# Patient Record
Sex: Male | Born: 1985 | Race: White | Hispanic: No | Marital: Single | Smoking: Current every day smoker
Health system: Southern US, Community
[De-identification: ages and names within clinical notes are randomized; demographics above are authoritative.]

## PROBLEM LIST (undated history)

## (undated) DIAGNOSIS — M549 Dorsalgia, unspecified: Secondary | ICD-10-CM

## (undated) DIAGNOSIS — G8929 Other chronic pain: Secondary | ICD-10-CM

## (undated) DIAGNOSIS — J45909 Unspecified asthma, uncomplicated: Secondary | ICD-10-CM

---
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
## 2004-06-18 ENCOUNTER — Emergency Department (HOSPITAL_COMMUNITY): Admission: EM | Admit: 2004-06-18 | Discharge: 2004-06-19 | Payer: Self-pay | Admitting: Emergency Medicine

## 2007-07-20 ENCOUNTER — Emergency Department (HOSPITAL_COMMUNITY): Admission: EM | Admit: 2007-07-20 | Discharge: 2007-07-20 | Payer: Self-pay | Admitting: Emergency Medicine

## 2007-12-20 ENCOUNTER — Emergency Department (HOSPITAL_COMMUNITY): Admission: EM | Admit: 2007-12-20 | Discharge: 2007-12-20 | Payer: Self-pay | Admitting: Emergency Medicine

## 2010-03-06 IMAGING — CR DG RIBS W/ CHEST 3+V*L*
3 series · 3 of 3 positions shown · non-contrast
Comparison: None

CLINICAL DATA: Patient fell of a tree stand.  Left-sided rib pain

LEFT RIBS AND CHEST - 3+ VIEW

[w chest pa]
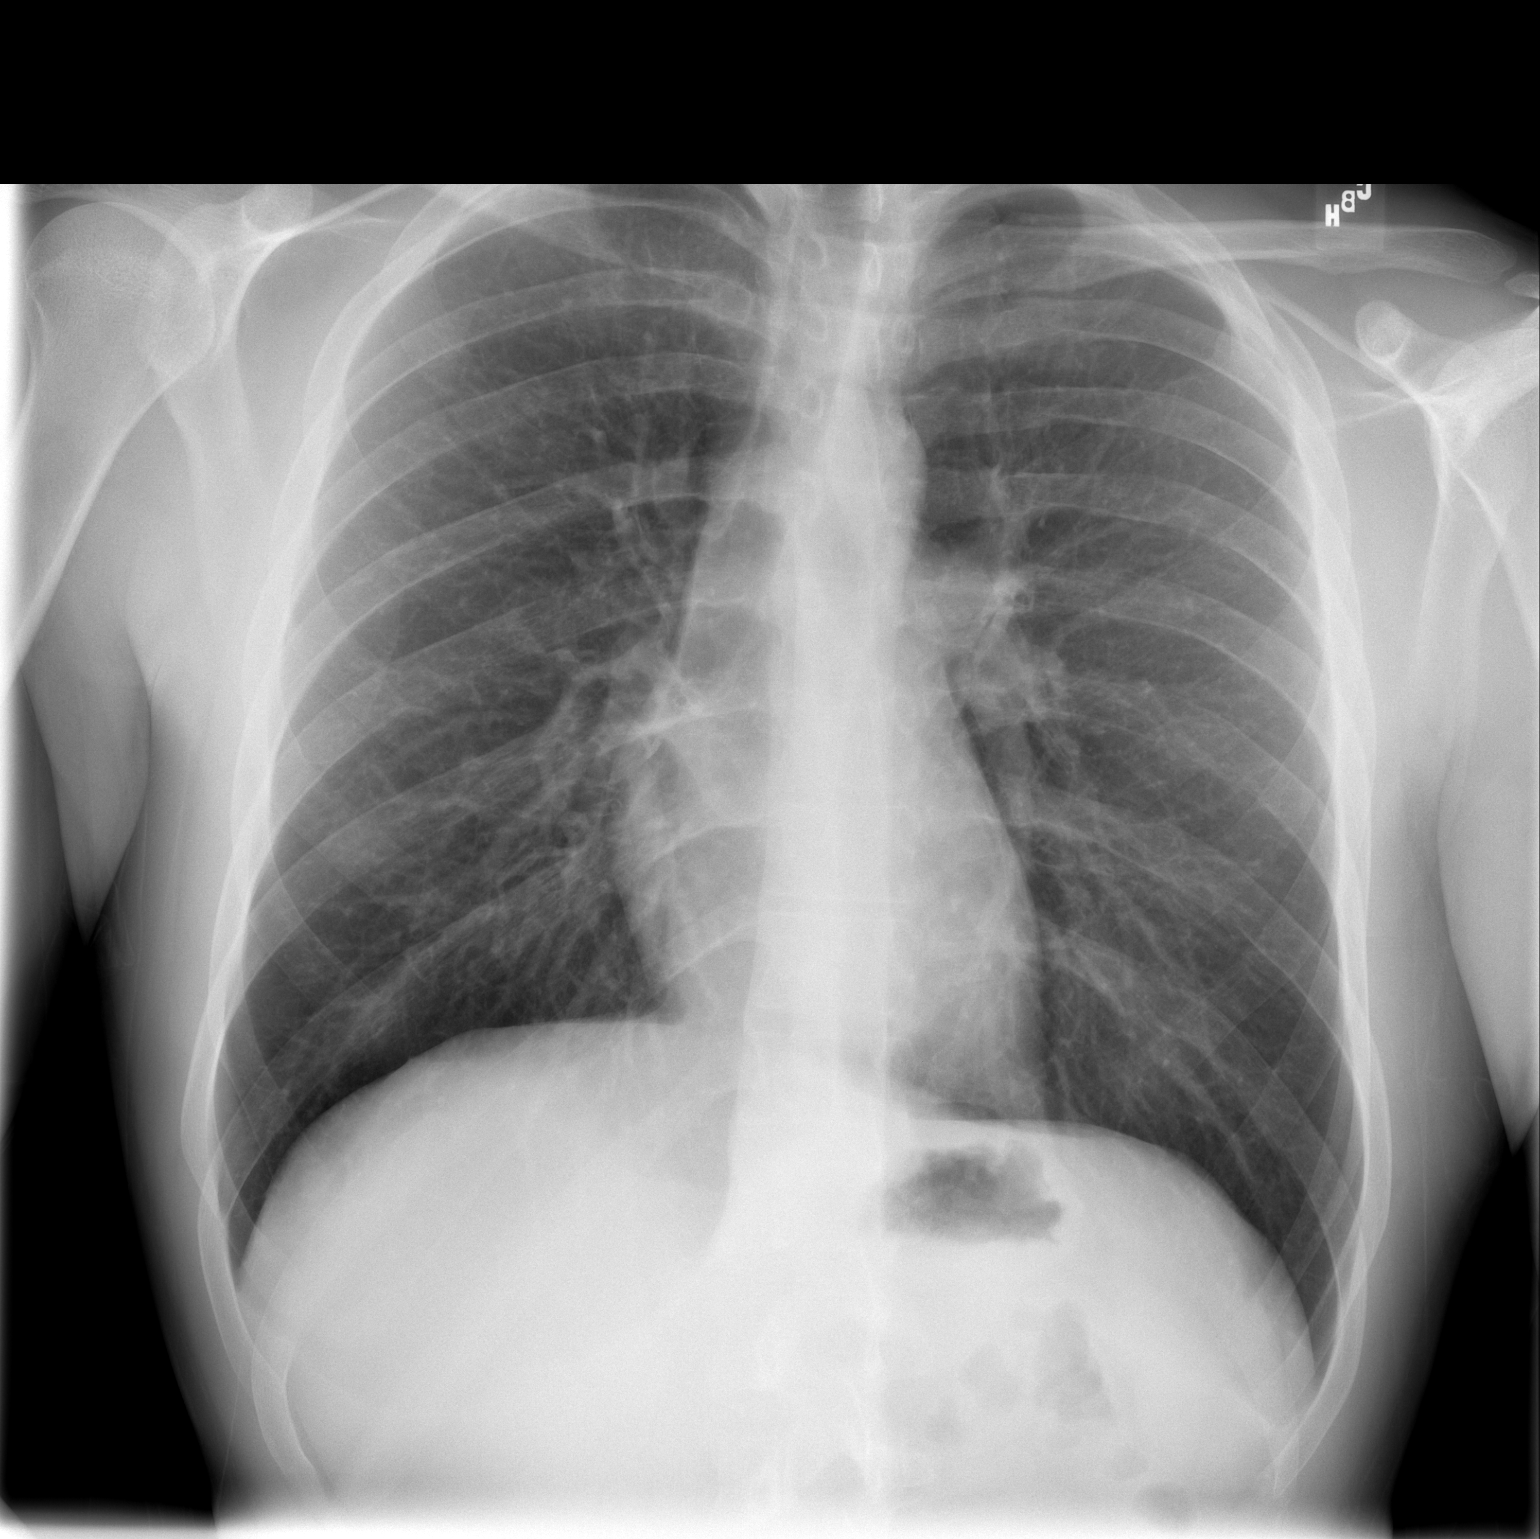

[w ribs ap/pa upper left]
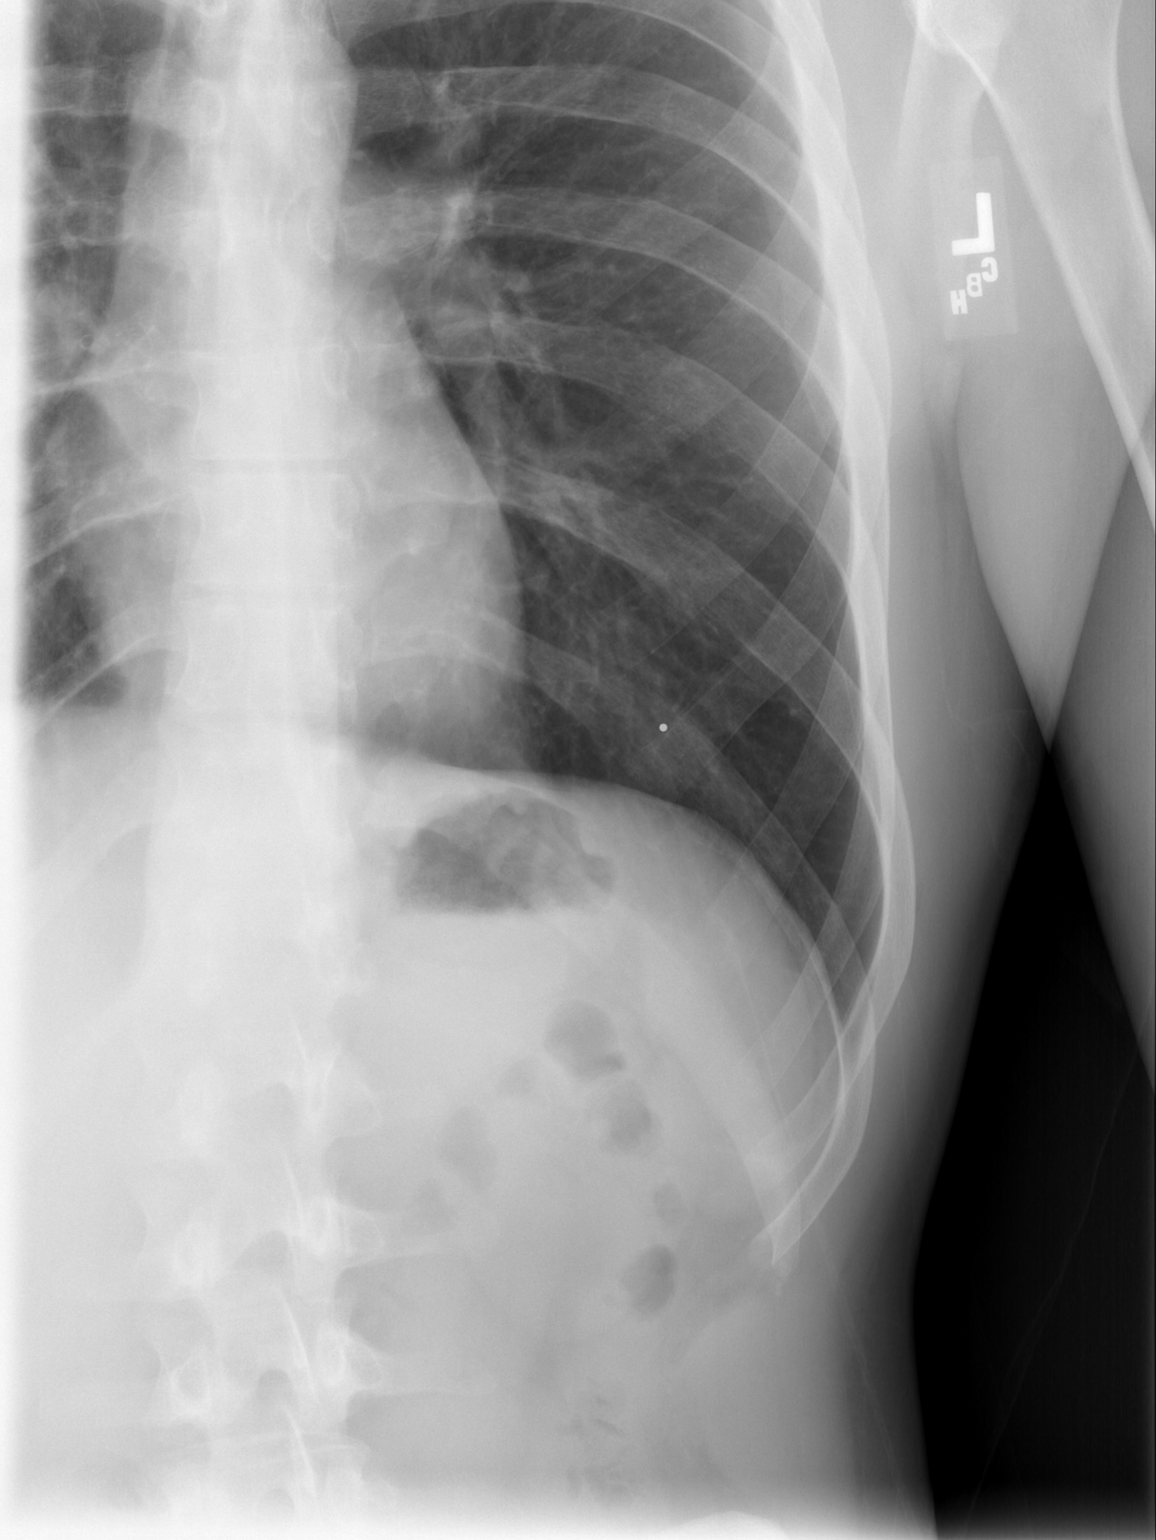

[w ribs oblique left]
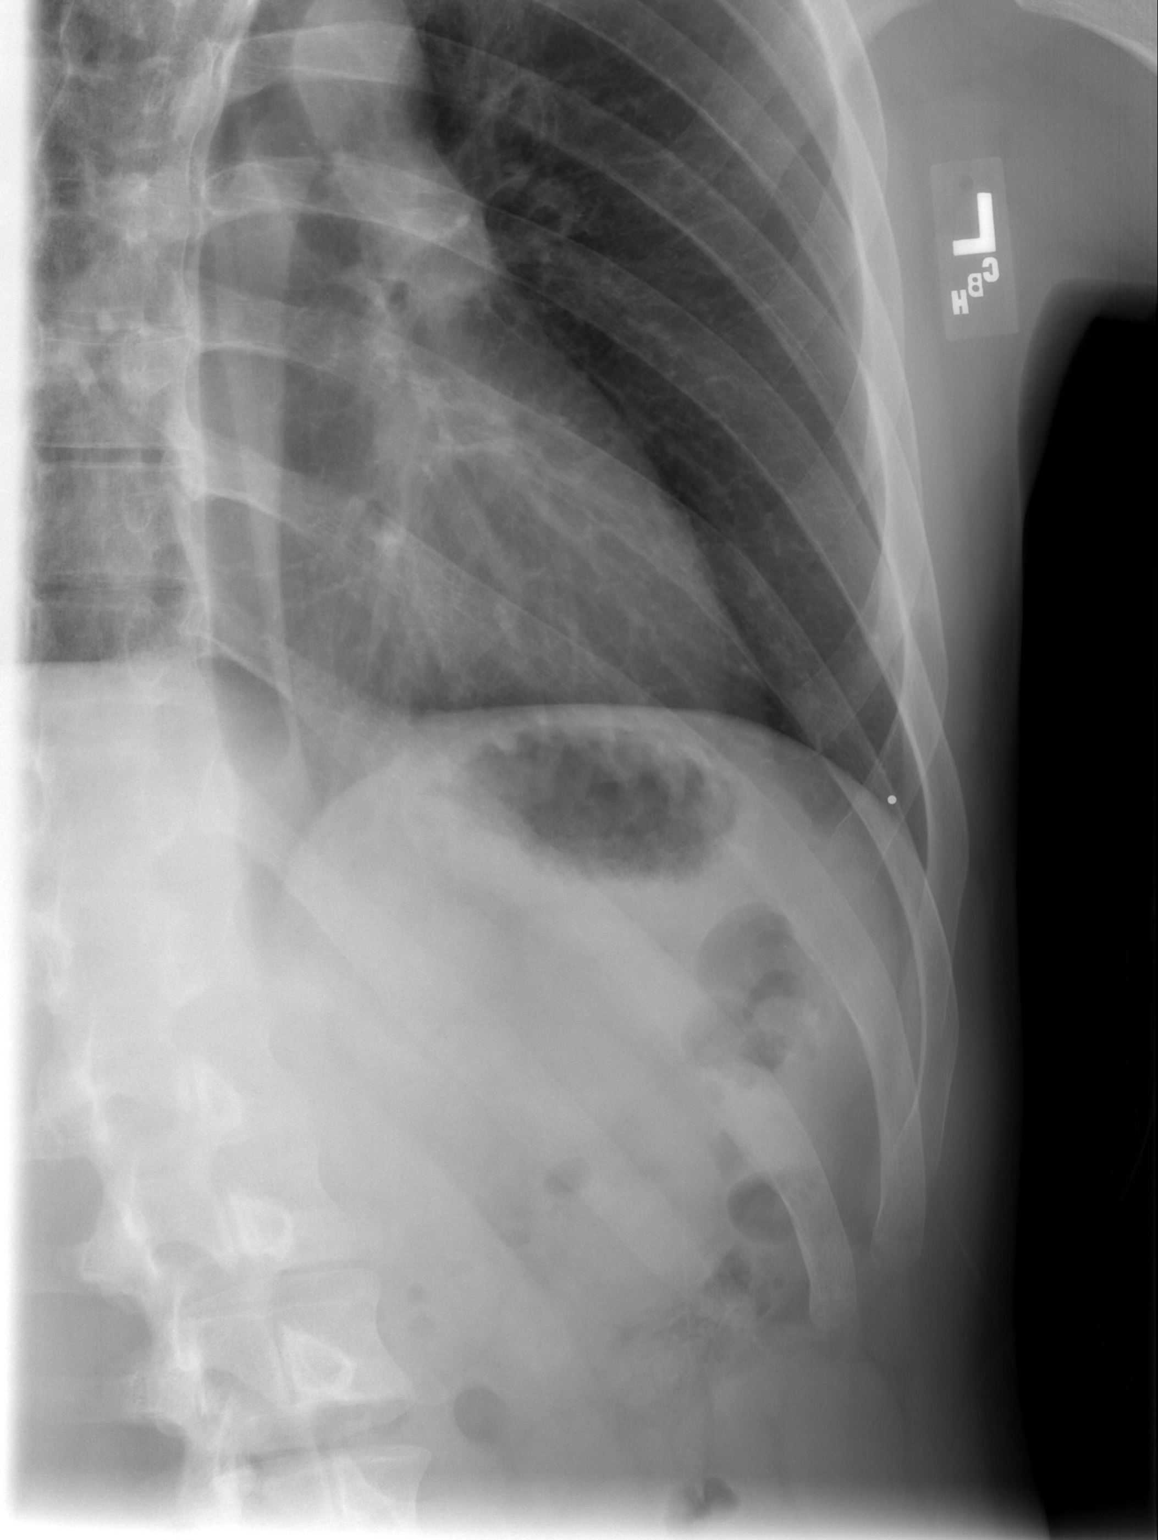

[3 of 3 positions shown; findings below may reference images not displayed]

FINDINGS: The visualized ribs are intact.  No fracture.
IMPRESSION: Negative for fracture.

## 2011-11-16 ENCOUNTER — Emergency Department: Payer: Self-pay | Admitting: Emergency Medicine

## 2012-09-02 ENCOUNTER — Emergency Department (HOSPITAL_COMMUNITY)
Admission: EM | Admit: 2012-09-02 | Discharge: 2012-09-02 | Disposition: A | Discharge status: Home / Self Care | Payer: Self-pay | Attending: Emergency Medicine | Admitting: Emergency Medicine

## 2012-09-02 ENCOUNTER — Encounter (HOSPITAL_COMMUNITY): Payer: Self-pay | Admitting: *Deleted

## 2012-09-02 DIAGNOSIS — Z791 Long term (current) use of non-steroidal anti-inflammatories (NSAID): Secondary | ICD-10-CM | POA: Insufficient documentation

## 2012-09-02 DIAGNOSIS — J45901 Unspecified asthma with (acute) exacerbation: Secondary | ICD-10-CM | POA: Insufficient documentation

## 2012-09-02 DIAGNOSIS — IMO0002 Reserved for concepts with insufficient information to code with codable children: Secondary | ICD-10-CM | POA: Insufficient documentation

## 2012-09-02 DIAGNOSIS — F17200 Nicotine dependence, unspecified, uncomplicated: Secondary | ICD-10-CM | POA: Insufficient documentation

## 2012-09-02 DIAGNOSIS — R0789 Other chest pain: Secondary | ICD-10-CM | POA: Insufficient documentation

## 2012-09-02 DIAGNOSIS — G8929 Other chronic pain: Secondary | ICD-10-CM | POA: Insufficient documentation

## 2012-09-02 DIAGNOSIS — M549 Dorsalgia, unspecified: Secondary | ICD-10-CM | POA: Insufficient documentation

## 2012-09-02 HISTORY — DX: Unspecified asthma, uncomplicated: J45.909

## 2012-09-02 MED ORDER — ALBUTEROL SULFATE (5 MG/ML) 0.5% IN NEBU
5.0000 mg | INHALATION_SOLUTION | Freq: Once | RESPIRATORY_TRACT | Status: AC
  Administered 2012-09-02: 5 mg via RESPIRATORY_TRACT
  Filled 2012-09-02: qty 1

## 2012-09-02 MED ORDER — PREDNISONE 20 MG PO TABS: 60.0000 mg | ORAL_TABLET | Freq: Every day | ORAL | Status: DC

## 2012-09-02 MED ORDER — IPRATROPIUM BROMIDE 0.02 % IN SOLN
0.5000 mg | Freq: Once | RESPIRATORY_TRACT | Status: AC
  Administered 2012-09-02: 0.5 mg via RESPIRATORY_TRACT
  Filled 2012-09-02 (×2): qty 2.5

## 2012-09-02 MED ORDER — PREDNISONE 50 MG PO TABS
60.0000 mg | ORAL_TABLET | Freq: Once | ORAL | Status: AC
  Administered 2012-09-02: 60 mg via ORAL
  Filled 2012-09-02: qty 1

## 2012-09-02 MED ORDER — ALBUTEROL SULFATE HFA 108 (90 BASE) MCG/ACT IN AERS
2.0000 | INHALATION_SPRAY | RESPIRATORY_TRACT | Status: DC
  Administered 2012-09-02: 2 via RESPIRATORY_TRACT
  Filled 2012-09-02: qty 6.7

## 2012-09-02 MED ORDER — ALBUTEROL SULFATE HFA 108 (90 BASE) MCG/ACT IN AERS: 1.0000 | INHALATION_SPRAY | Freq: Four times a day (QID) | RESPIRATORY_TRACT | Status: AC | PRN

## 2012-09-02 NOTE — ED Notes (Signed)
States that he has been having problems breathing since last night, states that he has a history of asthma attacks.  Expiratory wheezing noted at triage, however no obvious respiratory distress, O2 sat 100% on room air.

## 2012-09-07 ENCOUNTER — Emergency Department (HOSPITAL_COMMUNITY)
Admission: EM | Admit: 2012-09-07 | Discharge: 2012-09-07 | Disposition: A | Discharge status: Home / Self Care | Payer: Self-pay | Attending: Emergency Medicine | Admitting: Emergency Medicine

## 2012-09-07 ENCOUNTER — Encounter (HOSPITAL_COMMUNITY): Payer: Self-pay

## 2012-09-07 DIAGNOSIS — Y929 Unspecified place or not applicable: Secondary | ICD-10-CM | POA: Insufficient documentation

## 2012-09-07 DIAGNOSIS — F17200 Nicotine dependence, unspecified, uncomplicated: Secondary | ICD-10-CM | POA: Insufficient documentation

## 2012-09-07 DIAGNOSIS — IMO0002 Reserved for concepts with insufficient information to code with codable children: Secondary | ICD-10-CM | POA: Insufficient documentation

## 2012-09-07 DIAGNOSIS — J45909 Unspecified asthma, uncomplicated: Secondary | ICD-10-CM | POA: Insufficient documentation

## 2012-09-07 DIAGNOSIS — X500XXA Overexertion from strenuous movement or load, initial encounter: Secondary | ICD-10-CM | POA: Insufficient documentation

## 2012-09-07 DIAGNOSIS — S39012A Strain of muscle, fascia and tendon of lower back, initial encounter: Secondary | ICD-10-CM

## 2012-09-07 DIAGNOSIS — Y9389 Activity, other specified: Secondary | ICD-10-CM | POA: Insufficient documentation

## 2012-09-07 DIAGNOSIS — S335XXA Sprain of ligaments of lumbar spine, initial encounter: Secondary | ICD-10-CM | POA: Insufficient documentation

## 2012-09-07 HISTORY — DX: Dorsalgia, unspecified: M54.9

## 2012-09-07 HISTORY — DX: Other chronic pain: G89.29

## 2012-09-07 MED ORDER — NAPROXEN 500 MG PO TABS: 500.0000 mg | ORAL_TABLET | Freq: Two times a day (BID) | ORAL | Status: AC

## 2012-09-07 MED ORDER — OXYCODONE-ACETAMINOPHEN 5-325 MG PO TABS
1.0000 | ORAL_TABLET | ORAL | Status: AC | PRN
Start: 2012-09-07 — End: ?

## 2012-09-07 NOTE — ED Notes (Signed)
States he injured back lifting furniture yesterday

## 2012-09-07 NOTE — ED Provider Notes (Signed)
CSN: [id number]     Arrival date & time 09/07/12  1325 History     First MD Initiated Contact with Patient 09/07/12 1409     Chief Complaint  Patient presents with  . Back Pain   (Consider location/radiation/quality/duration/timing/severity/associated sxs/prior Treatment) Patient is a 27 y.o. male presenting with back pain. The history is provided by the patient.  Back Pain Location:  Lumbar spine Quality:  Aching and shooting Radiates to:  R thigh and R posterior upper leg Pain severity:  Moderate Pain is:  Same all the time Onset quality:  Gradual Duration:  1 day Timing:  Constant Progression:  Worsening Chronicity:  Recurrent Context: twisting   Context: not recent injury   Relieved by:  Bed rest Ineffective treatments:  Muscle relaxants and OTC medications Associated symptoms: leg pain   Associated symptoms: no abdominal pain, no abdominal swelling, no bladder incontinence, no bowel incontinence, no chest pain, no dysuria, no fever, no headaches, no numbness, no paresthesias, no pelvic pain, no perianal numbness, no tingling, no weakness and no weight loss     Past Medical History  Diagnosis Date  . Asthma   . Chronic back pain    No past surgical history on file. No family history on file. History  Substance Use Topics  . Smoking status: Current Every Day Smoker -- 0.50 packs/day    Types: Cigarettes  . Smokeless tobacco: Not on file  . Alcohol Use: Yes     Comment: social    Review of Systems  Constitutional: Negative for fever and weight loss.  Respiratory: Negative for shortness of breath.   Cardiovascular: Negative for chest pain.  Gastrointestinal: Negative for vomiting, abdominal pain, constipation and bowel incontinence.  Genitourinary: Negative for bladder incontinence, dysuria, hematuria, flank pain, decreased urine volume, difficulty urinating and pelvic pain.       No perineal numbness or incontinence of urine or feces  Musculoskeletal: Positive  for back pain. Negative for joint swelling.  Skin: Negative for rash.  Neurological: Negative for tingling, weakness, numbness, headaches and paresthesias.  All other systems reviewed and are negative.    Allergies  Review of patient's allergies indicates no known allergies.  Home Medications   Current Outpatient Rx  Name  Route  Sig  Dispense  Refill  . cyclobenzaprine (FLEXERIL) 10 MG tablet   Oral   Take 10 mg by mouth 3 (three) times daily as needed for muscle spasms.         Marland Kitchen HYDROcodone-acetaminophen (NORCO) 10-325 MG per tablet   Oral   Take 1 tablet by mouth every 6 (six) hours as needed for pain.         Marland Kitchen albuterol (PROVENTIL HFA;VENTOLIN HFA) 108 (90 BASE) MCG/ACT inhaler   Inhalation   Inhale 1-2 puffs into the lungs every 6 (six) hours as needed for wheezing.   1 Inhaler   0   . predniSONE (DELTASONE) 20 MG tablet   Oral   Take 3 tablets (60 mg total) by mouth daily.   12 tablet   0    BP 141/81  Pulse 86  Temp(Src) 98.3 F (36.8 C) (Oral)  Resp 18  Ht 5\' 10"  (1.778 m)  Wt 180 lb (81.647 kg)  BMI 25.83 kg/m2  SpO2 98% Physical Exam  Nursing note and vitals reviewed. Constitutional: He is oriented to person, place, and time. He appears well-developed and well-nourished. No distress.  HENT:  Head: Normocephalic and atraumatic.  Neck: Normal range of motion. Neck supple.  Cardiovascular: Normal rate, regular rhythm, normal heart sounds and intact distal pulses.   No murmur heard. Pulmonary/Chest: Effort normal and breath sounds normal. No respiratory distress.  Musculoskeletal: He exhibits tenderness. He exhibits no edema.       Lumbar back: He exhibits tenderness and pain. He exhibits normal range of motion, no swelling, no deformity, no laceration and normal pulse.  ttp of the right lumbar spine and paraspinal muscles. DP pulses are brisk and symmetrical.  Distal sensation intact.  Hip Flexors/Extensors are intact  Neurological: He is alert and  oriented to person, place, and time. No cranial nerve deficit or sensory deficit. He exhibits normal muscle tone. Coordination and gait normal.  Reflex Scores:      Patellar reflexes are 2+ on the right side and 2+ on the left side.      Achilles reflexes are 2+ on the right side and 2+ on the left side. Skin: Skin is warm and dry.    ED Course   Procedures (including critical care time)  Labs Reviewed - No data to display No results found. No diagnosis found.  MDM    Patient has ttp of the right lumbar paraspinal muscles.  No focal neuro deficits on exam.  Ambulates with a steady gait.   Pain similar to previous pain.  Doubt emergent neuro or infectious process.  Advised pt that he will need further management by his PMD or pain management.  Referral info given.  VSS.  Patient appears stable for d/c  Kaileah Shevchenko L. Trisha Mangle, PA-C 09/08/12 2115

## 2012-09-07 NOTE — ED Provider Notes (Signed)
CSN: [id number]     Arrival date & time 09/02/12  3086 History     First MD Initiated Contact with Patient 09/02/12 1007     Chief Complaint  Patient presents with  . Asthma   (Consider location/radiation/quality/duration/timing/severity/associated sxs/prior Treatment) Patient is a 27 y.o. male presenting with asthma. The history is provided by the patient.  Asthma This is a recurrent problem. The current episode started yesterday. The problem has been unchanged. Pertinent negatives include no abdominal pain, arthralgias, chest pain, chills, congestion, coughing, fatigue, fever, headaches, joint swelling, nausea, neck pain, numbness, rash, sore throat, swollen glands or weakness. The symptoms are aggravated by exertion. He has tried nothing (He is out of his albuterol) for the symptoms.    Past Medical History  Diagnosis Date  . Asthma   . Chronic back pain    History reviewed. No pertinent past surgical history. No family history on file. History  Substance Use Topics  . Smoking status: Current Every Day Smoker -- 0.50 packs/day    Types: Cigarettes  . Smokeless tobacco: Not on file  . Alcohol Use: Yes     Comment: social    Review of Systems  Constitutional: Negative for fever, chills and fatigue.  HENT: Negative for congestion, sore throat and neck pain.   Eyes: Negative.   Respiratory: Positive for chest tightness and wheezing. Negative for cough and shortness of breath.   Cardiovascular: Negative for chest pain.  Gastrointestinal: Negative for nausea and abdominal pain.  Genitourinary: Negative.   Musculoskeletal: Negative for joint swelling and arthralgias.  Skin: Negative.  Negative for rash and wound.  Neurological: Negative for dizziness, weakness, light-headedness, numbness and headaches.  Psychiatric/Behavioral: Negative.     Allergies  Review of patient's allergies indicates no known allergies.  Home Medications   Current Outpatient Rx  Name  Route  Sig   Dispense  Refill  . albuterol (PROVENTIL HFA;VENTOLIN HFA) 108 (90 BASE) MCG/ACT inhaler   Inhalation   Inhale 1-2 puffs into the lungs every 6 (six) hours as needed for wheezing.   1 Inhaler   0   . cyclobenzaprine (FLEXERIL) 10 MG tablet   Oral   Take 10 mg by mouth 3 (three) times daily as needed for muscle spasms.         Marland Kitchen HYDROcodone-acetaminophen (NORCO) 10-325 MG per tablet   Oral   Take 1 tablet by mouth every 6 (six) hours as needed for pain.         . naproxen (NAPROSYN) 500 MG tablet   Oral   Take 1 tablet (500 mg total) by mouth 2 (two) times daily with a meal.   20 tablet   0   . oxyCODONE-acetaminophen (PERCOCET/ROXICET) 5-325 MG per tablet   Oral   Take 1 tablet by mouth every 4 (four) hours as needed for pain.   10 tablet   0   . predniSONE (DELTASONE) 20 MG tablet   Oral   Take 3 tablets (60 mg total) by mouth daily.   12 tablet   0    BP 121/73  Pulse 79  Temp(Src) 97.9 F (36.6 C) (Oral)  Resp 20  Wt 180 lb (81.647 kg)  SpO2 96% Physical Exam  Nursing note and vitals reviewed. Constitutional: He appears well-developed and well-nourished.  HENT:  Head: Normocephalic and atraumatic.  Eyes: Conjunctivae are normal.  Neck: Normal range of motion.  Cardiovascular: Normal rate, regular rhythm, normal heart sounds and intact distal pulses.   Pulmonary/Chest: Effort  normal. No respiratory distress. He has no wheezes.  Decreased breath sounds throughout all lung fields. He has no active wheezing on exam.  Abdominal: Soft. Bowel sounds are normal. There is no tenderness.  Musculoskeletal: Normal range of motion.  Neurological: He is alert.  Skin: Skin is warm and dry.  Psychiatric: He has a normal mood and affect.    ED Course   Procedures (including critical care time)  Labs Reviewed - No data to display No results found. 1. Asthma attack    Medications  albuterol (PROVENTIL) (5 MG/ML) 0.5% nebulizer solution 5 mg (5 mg Nebulization  Given 09/02/12 1035)  ipratropium (ATROVENT) nebulizer solution 0.5 mg (0.5 mg Nebulization Given 09/02/12 1035)  predniSONE (DELTASONE) tablet 60 mg (60 mg Oral Given 09/02/12 1023)   Pt examined after albuterol and atrovent neb,  Prednisone given with great improvement in aeration.  No wheezing at re-exam.  Pt felt at his baseline with his breathing. Breath sounds equal bilaterally, no rhonchi,  Or history suggesting need for xrays. MDM  The patient appears reasonably screened and/or stabilized for discharge and I doubt any other medical condition or other [facility] requiring further screening, evaluation, or treatment in the ED at this time prior to discharge. Pt was given an albuterol mdi,  Also prescription for same, prednisone 5 day pulse dose.  Encouraged return here for any worsened sx.  Pt understands plan.  Burgess Amor, PA-C 09/07/12 2258

## 2012-09-07 NOTE — ED Notes (Signed)
Pt woke today having low back pain, able to drive self to the ed. In wheelchair in triage, stated his L4 is damaged.

## 2012-09-09 NOTE — ED Provider Notes (Signed)
History/physical exam/procedure(s) were performed by non-physician practitioner and as supervising physician I was immediately available for consultation/collaboration. I have reviewed all notes and am in agreement with care and plan.   Hilario Quarry, MD 09/09/12 [phone]

## 2012-09-14 NOTE — ED Provider Notes (Signed)
Medical screening examination/treatment/procedure(s) were performed by non-physician practitioner and as supervising physician I was immediately available for consultation/collaboration.   Jjesus Dingley W. Lilygrace Rodick, MD 09/14/12 0005 

## 2014-02-19 ENCOUNTER — Emergency Department (HOSPITAL_COMMUNITY): Payer: Medicaid Other

## 2014-02-19 ENCOUNTER — Inpatient Hospital Stay (HOSPITAL_COMMUNITY)
Admission: EM | Admit: 2014-02-19 | Discharge: 2014-02-20 | DRG: 203 | Disposition: A | Discharge status: Home / Self Care | Payer: Medicaid Other | Attending: Internal Medicine | Admitting: Internal Medicine

## 2014-02-19 ENCOUNTER — Encounter (HOSPITAL_COMMUNITY): Payer: Self-pay | Admitting: *Deleted

## 2014-02-19 DIAGNOSIS — J209 Acute bronchitis, unspecified: Secondary | ICD-10-CM | POA: Diagnosis present

## 2014-02-19 DIAGNOSIS — Z720 Tobacco use: Secondary | ICD-10-CM | POA: Diagnosis present

## 2014-02-19 DIAGNOSIS — R0602 Shortness of breath: Secondary | ICD-10-CM | POA: Diagnosis present

## 2014-02-19 DIAGNOSIS — F419 Anxiety disorder, unspecified: Secondary | ICD-10-CM | POA: Diagnosis present

## 2014-02-19 DIAGNOSIS — R000 Tachycardia, unspecified: Secondary | ICD-10-CM | POA: Diagnosis present

## 2014-02-19 DIAGNOSIS — J45901 Unspecified asthma with (acute) exacerbation: Principal | ICD-10-CM

## 2014-02-19 DIAGNOSIS — Z8249 Family history of ischemic heart disease and other diseases of the circulatory system: Secondary | ICD-10-CM | POA: Diagnosis not present

## 2014-02-19 DIAGNOSIS — F1020 Alcohol dependence, uncomplicated: Secondary | ICD-10-CM | POA: Diagnosis present

## 2014-02-19 DIAGNOSIS — F17210 Nicotine dependence, cigarettes, uncomplicated: Secondary | ICD-10-CM | POA: Diagnosis present

## 2014-02-19 LAB — BASIC METABOLIC PANEL
Anion gap: 11 (ref 5–15)
BUN: 15 mg/dL (ref 6–23)
CHLORIDE: 105 meq/L (ref 96–112)
CO2: 23 mmol/L (ref 19–32)
Calcium: 9 mg/dL (ref 8.4–10.5)
Creatinine, Ser: 0.98 mg/dL (ref 0.50–1.35)
GFR calc Af Amer: >90 mL/min (ref >90)
GLUCOSE: 87 mg/dL (ref 70–99)
Potassium: 3.5 mmol/L (ref 3.5–5.1)
SODIUM: 139 mmol/L (ref 135–145)

## 2014-02-19 LAB — CBC WITH DIFFERENTIAL/PLATELET
BASOS ABS: 0 10*3/uL (ref 0.0–0.1)
BASOS PCT: 0 % (ref 0–1)
EOS ABS: 0.7 10*3/uL (ref 0.0–0.7)
EOS PCT: 4 % (ref 0–5)
HEMATOCRIT: 46.6 % (ref 39.0–52.0)
HEMOGLOBIN: 15.7 g/dL (ref 13.0–17.0)
LYMPHS PCT: 15 % (ref 12–46)
Lymphs Abs: 2.7 10*3/uL (ref 0.7–4.0)
MCH: 29.4 pg (ref 26.0–34.0)
MCHC: 33.7 g/dL (ref 30.0–36.0)
MCV: 87.3 fL (ref 78.0–100.0)
Monocytes Absolute: 1.4 10*3/uL — ABNORMAL HIGH (ref 0.1–1.0)
Monocytes Relative: 8 % (ref 3–12)
Neutro Abs: 13.5 10*3/uL — ABNORMAL HIGH (ref 1.7–7.7)
Neutrophils Relative %: 73 % (ref 43–77)
Platelets: 347 10*3/uL (ref 150–400)
RBC: 5.34 MIL/uL (ref 4.22–5.81)
RDW: 12.6 % (ref 11.5–15.5)
WBC: 18.3 10*3/uL — AB (ref 4.0–10.5)

## 2014-02-19 MED ORDER — METHYLPREDNISOLONE SODIUM SUCC 125 MG IJ SOLR
80.0000 mg | Freq: Three times a day (TID) | INTRAMUSCULAR | Status: DC
  Administered 2014-02-20: 80 mg via INTRAVENOUS
  Filled 2014-02-19 (×4): qty 1.28

## 2014-02-19 MED ORDER — ENOXAPARIN SODIUM 40 MG/0.4ML ~~LOC~~ SOLN
40.0000 mg | Freq: Every day | SUBCUTANEOUS | Status: DC
  Administered 2014-02-19: 40 mg via SUBCUTANEOUS
  Filled 2014-02-19 (×2): qty 0.4

## 2014-02-19 MED ORDER — ALPRAZOLAM 1 MG PO TABS
1.0000 mg | ORAL_TABLET | Freq: Every day | ORAL | Status: DC
  Administered 2014-02-20: 1 mg via ORAL
  Filled 2014-02-19: qty 1

## 2014-02-19 MED ORDER — AZITHROMYCIN 250 MG PO TABS
250.0000 mg | ORAL_TABLET | Freq: Every day | ORAL | Status: DC
  Filled 2014-02-19: qty 1

## 2014-02-19 MED ORDER — SODIUM CHLORIDE 0.9 % IJ SOLN
3.0000 mL | Freq: Two times a day (BID) | INTRAMUSCULAR | Status: DC
  Administered 2014-02-20: 3 mL via INTRAVENOUS

## 2014-02-19 MED ORDER — SODIUM CHLORIDE 0.9 % IV SOLN: 250.0000 mL | INTRAVENOUS | Status: DC | PRN

## 2014-02-19 MED ORDER — CHLORDIAZEPOXIDE HCL 25 MG PO CAPS
50.0000 mg | ORAL_CAPSULE | Freq: Every day | ORAL | Status: AC
  Administered 2014-02-19: 50 mg via ORAL
  Filled 2014-02-19: qty 2

## 2014-02-19 MED ORDER — IPRATROPIUM-ALBUTEROL 0.5-2.5 (3) MG/3ML IN SOLN
3.0000 mL | Freq: Once | RESPIRATORY_TRACT | Status: AC
  Administered 2014-02-19: 3 mL via RESPIRATORY_TRACT
  Filled 2014-02-19: qty 3

## 2014-02-19 MED ORDER — NICOTINE 21 MG/24HR TD PT24
21.0000 mg | MEDICATED_PATCH | Freq: Every day | TRANSDERMAL | Status: DC | PRN
  Administered 2014-02-19: 21 mg via TRANSDERMAL
  Filled 2014-02-19 (×2): qty 1

## 2014-02-19 MED ORDER — ACETAMINOPHEN 650 MG RE SUPP: 650.0000 mg | Freq: Four times a day (QID) | RECTAL | Status: DC | PRN

## 2014-02-19 MED ORDER — ALBUTEROL SULFATE (2.5 MG/3ML) 0.083% IN NEBU
2.5000 mg | INHALATION_SOLUTION | Freq: Four times a day (QID) | RESPIRATORY_TRACT | Status: DC | PRN
  Administered 2014-02-20 (×3): 2.5 mg via RESPIRATORY_TRACT
  Filled 2014-02-19 (×3): qty 3

## 2014-02-19 MED ORDER — LORAZEPAM 2 MG/ML IJ SOLN
1.0000 mg | Freq: Once | INTRAMUSCULAR | Status: AC
  Administered 2014-02-19: 1 mg via INTRAVENOUS
  Filled 2014-02-19: qty 1

## 2014-02-19 MED ORDER — SODIUM CHLORIDE 0.9 % IJ SOLN
3.0000 mL | Freq: Two times a day (BID) | INTRAMUSCULAR | Status: DC
Start: 2014-02-19 — End: 2014-02-20

## 2014-02-19 MED ORDER — SODIUM CHLORIDE 0.9 % IJ SOLN: 3.0000 mL | INTRAMUSCULAR | Status: DC | PRN

## 2014-02-19 MED ORDER — AZITHROMYCIN 500 MG PO TABS
500.0000 mg | ORAL_TABLET | Freq: Every day | ORAL | Status: AC
  Administered 2014-02-19: 500 mg via ORAL
  Filled 2014-02-19: qty 1

## 2014-02-19 MED ORDER — ALBUTEROL SULFATE (2.5 MG/3ML) 0.083% IN NEBU
5.0000 mg | INHALATION_SOLUTION | Freq: Once | RESPIRATORY_TRACT | Status: AC
  Administered 2014-02-19: 5 mg via RESPIRATORY_TRACT
  Filled 2014-02-19: qty 6

## 2014-02-19 MED ORDER — CHLORDIAZEPOXIDE HCL 25 MG PO CAPS: 25.0000 mg | ORAL_CAPSULE | Freq: Every day | ORAL | Status: DC

## 2014-02-19 MED ORDER — KETOROLAC TROMETHAMINE 30 MG/ML IJ SOLN
30.0000 mg | Freq: Once | INTRAMUSCULAR | Status: AC
  Administered 2014-02-19: 30 mg via INTRAVENOUS
  Filled 2014-02-19: qty 1

## 2014-02-19 MED ORDER — SODIUM CHLORIDE 0.9 % IV BOLUS (SEPSIS)
1000.0000 mL | Freq: Once | INTRAVENOUS | Status: AC
  Administered 2014-02-19: 1000 mL via INTRAVENOUS

## 2014-02-19 MED ORDER — AZITHROMYCIN 250 MG PO TABS: 250.0000 mg | ORAL_TABLET | Freq: Every day | ORAL | Status: DC

## 2014-02-19 MED ORDER — OXYCODONE-ACETAMINOPHEN 5-325 MG PO TABS: 1.0000 | ORAL_TABLET | ORAL | Status: DC | PRN

## 2014-02-19 MED ORDER — ALBUTEROL SULFATE (2.5 MG/3ML) 0.083% IN NEBU
2.5000 mg | INHALATION_SOLUTION | Freq: Four times a day (QID) | RESPIRATORY_TRACT | Status: DC
  Administered 2014-02-20 (×2): 2.5 mg via RESPIRATORY_TRACT
  Filled 2014-02-19 (×2): qty 3

## 2014-02-19 MED ORDER — AZITHROMYCIN 500 MG PO TABS: 500.0000 mg | ORAL_TABLET | Freq: Every day | ORAL | Status: DC

## 2014-02-19 MED ORDER — ACETAMINOPHEN 325 MG PO TABS
650.0000 mg | ORAL_TABLET | Freq: Four times a day (QID) | ORAL | Status: DC | PRN
  Administered 2014-02-20: 650 mg via ORAL
  Filled 2014-02-19: qty 2

## 2014-02-19 MED ORDER — METHYLPREDNISOLONE SODIUM SUCC 125 MG IJ SOLR
125.0000 mg | Freq: Once | INTRAMUSCULAR | Status: AC
  Administered 2014-02-19: 125 mg via INTRAVENOUS
  Filled 2014-02-19: qty 2

## 2014-02-19 MED ORDER — METHYLPREDNISOLONE SODIUM SUCC 125 MG IJ SOLR
80.0000 mg | Freq: Three times a day (TID) | INTRAMUSCULAR | Status: DC
  Filled 2014-02-19: qty 1.28

## 2014-02-19 MED ORDER — ALBUTEROL (5 MG/ML) CONTINUOUS INHALATION SOLN
10.0000 mg/h | INHALATION_SOLUTION | RESPIRATORY_TRACT | Status: AC
  Administered 2014-02-19: 10 mg/h via RESPIRATORY_TRACT
  Filled 2014-02-19: qty 20

## 2014-02-19 NOTE — ED Provider Notes (Signed)
Medical screening examination/treatment/procedure(s) were conducted as a shared visit with non-physician practitioner(s) and myself.  I personally evaluated the patient during the encounter.   EKG Interpretation   Date/Time:  Saturday February 19 2014 19:18:28 EST Ventricular Rate:  138 PR Interval:  115 QRS Duration: 87 QT Interval:  365 QTC Calculation: 553 R Axis:   -6 Text Interpretation:  Sinus tachycardia Nonspecific T abnormalities,  lateral leads Prolonged QT interval Confirmed by Juleen ChinaKOHUT  MD, Hether Anselmo ([id number])  on 02/19/2014 7:52:31 PM     28yM with dyspnea and wheezing. Hx of asthma. Frequent symptoms more recently. Finished course of steroids 2 days ago. May be reason for leukocytosis. Afebrile. No infiltrate on XR. Solumedrol in ED. Nebs.  Pt presented very tachy. On further hx, admits to daily drinking. Drinks 1/2 case most days. Only a couple beers so far today. Not sure how much symptoms related to asthma exacerbation, etoh withdrawal, anxiety. Likely some combination of all. Continues to be significantly tachy and borderline oxygen sats. No home oxygen requirement. Discussed DC with continued steroids versus admit. I feel both are reasonable options currently. Will discuss with medicine.   Raeford RazorStephen Ayaat Jansma, MD 02/19/14 2202

## 2014-02-19 NOTE — ED Notes (Signed)
Hospitalist at bedside 

## 2014-02-19 NOTE — H&P (Addendum)
John Fox is an 29 y.o. male.    Pcp:  Unassigned  Chief Complaint: sob, wheezing HPI: 29 yo male with asthma, chronic tobacco use, apparently was at [facility] and had dyspnea. Pt was dropped off by his girlfriend.  Pt denies fever, chills, cp, palp, has cough with occasional yellow/brown sputum but mostly clear.  Denies n/v, diarrhea, brbpr, black stool.  In ED pt given steroids and albuterol and still wheezing,  Admission requested for asthma exacerbation. Pt notes that has been compliant w medication.   Past Medical History  Diagnosis Date  . Asthma   . Chronic back pain     History reviewed. No pertinent past surgical history.  Family History  Problem Relation Age of Onset  . Hypertension Father    Social History:  reports that he has been smoking Cigarettes.  He has a 7.5 pack-year smoking history. He does not have any smokeless tobacco history on file. He reports that he drinks about 30.0 oz of alcohol per week. He reports that he does not use illicit drugs.  Allergies: No Known Allergies   (Not in a hospital admission)  Results for orders placed or performed during the hospital encounter of 02/19/14 (from the past 48 hour(s))  CBC with Differential     Status: Abnormal   Collection Time: 02/19/14  7:34 PM  Result Value Ref Range   WBC 18.3 (H) 4.0 - 10.5 K/uL   RBC 5.34 4.22 - 5.81 MIL/uL   Hemoglobin 15.7 13.0 - 17.0 g/dL   HCT 46.6 39.0 - 52.0 %   MCV 87.3 78.0 - 100.0 fL   MCH 29.4 26.0 - 34.0 pg   MCHC 33.7 30.0 - 36.0 g/dL   RDW 12.6 11.5 - 15.5 %   Platelets 347 150 - 400 K/uL   Neutrophils Relative % 73 43 - 77 %   Neutro Abs 13.5 (H) 1.7 - 7.7 K/uL   Lymphocytes Relative 15 12 - 46 %   Lymphs Abs 2.7 0.7 - 4.0 K/uL   Monocytes Relative 8 3 - 12 %   Monocytes Absolute 1.4 (H) 0.1 - 1.0 K/uL   Eosinophils Relative 4 0 - 5 %   Eosinophils Absolute 0.7 0.0 - 0.7 K/uL   Basophils Relative 0 0 - 1 %   Basophils Absolute 0.0 0.0 - 0.1 K/uL  Basic  metabolic panel     Status: None   Collection Time: 02/19/14  7:34 PM  Result Value Ref Range   Sodium 139 135 - 145 mmol/L    Comment: Please note change in reference range.   Potassium 3.5 3.5 - 5.1 mmol/L    Comment: Please note change in reference range.   Chloride 105 96 - 112 mEq/L   CO2 23 19 - 32 mmol/L   Glucose, Bld 87 70 - 99 mg/dL   BUN 15 6 - 23 mg/dL   Creatinine, Ser 0.98 0.50 - 1.35 mg/dL   Calcium 9.0 8.4 - 10.5 mg/dL   GFR calc non Af Amer >90 >90 mL/min   GFR calc Af Amer >90 >90 mL/min    Comment: (NOTE) The eGFR has been calculated using the CKD EPI equation. This calculation has not been validated in all clinical situations. eGFR's persistently <90 mL/min signify possible Chronic Kidney Disease.    Anion gap 11 5 - 15   Dg Chest 2 View  02/19/2014   CLINICAL DATA:  Short of breath, asthma  EXAM: CHEST  2 VIEW  COMPARISON:  Radiograph 01/25/2014  FINDINGS: Normal mediastinum and cardiac silhouette. Normal pulmonary vasculature. No evidence of effusion, infiltrate, or pneumothorax. No acute bony abnormality.  IMPRESSION: No acute cardiopulmonary process.   Electronically Signed   By: Suzy Bouchard M.D.   On: 02/19/2014 20:52    Review of Systems  Constitutional: Negative for fever, chills, weight loss, malaise/fatigue and diaphoresis.  HENT: Negative for congestion, ear discharge, ear pain, hearing loss, nosebleeds, sore throat and tinnitus.   Eyes: Negative for blurred vision, double vision, photophobia, pain, discharge and redness.  Respiratory: Positive for cough, shortness of breath and wheezing. Negative for hemoptysis, sputum production and stridor.   Cardiovascular: Negative for chest pain, palpitations, orthopnea, claudication, leg swelling and PND.  Gastrointestinal: Negative for heartburn, nausea, vomiting, abdominal pain, diarrhea, constipation, blood in stool and melena.  Genitourinary: Negative for dysuria, urgency, frequency, hematuria and flank  pain.  Musculoskeletal: Negative for myalgias, back pain, joint pain, falls and neck pain.  Skin: Negative for itching and rash.  Neurological: Negative for dizziness, tingling, tremors, sensory change, speech change, focal weakness, seizures, loss of consciousness, weakness and headaches.  Endo/Heme/Allergies: Negative for environmental allergies and polydipsia. Does not bruise/bleed easily.  Psychiatric/Behavioral: Negative for depression, suicidal ideas, hallucinations, memory loss and substance abuse. The patient is not nervous/anxious and does not have insomnia.     Blood pressure 120/69, pulse 138, temperature 98.4 F (36.9 C), temperature source Oral, resp. rate 18, SpO2 95 %. Physical Exam  Constitutional: He is oriented to person, place, and time. He appears well-developed and well-nourished.  HENT:  Head: Normocephalic and atraumatic.  Eyes: Conjunctivae and EOM are normal. Pupils are equal, round, and reactive to light.  Neck: Normal range of motion. Neck supple. No JVD present. No tracheal deviation present. No thyromegaly present.  Cardiovascular: Exam reveals no gallop and no friction rub.   No murmur heard. Tachycardia s1, s2,   Respiratory: He is in respiratory distress. He has wheezes. He has no rales. He exhibits no tenderness.  GI: Soft. Bowel sounds are normal. He exhibits no distension. There is no tenderness. There is no rebound and no guarding.  Musculoskeletal: Normal range of motion. He exhibits no edema or tenderness.  Lymphadenopathy:    He has no cervical adenopathy.  Neurological: He is alert and oriented to person, place, and time. He has normal reflexes. No cranial nerve deficit. Coordination normal.  Skin: Skin is warm and dry. No rash noted. No erythema. No pallor.  Psychiatric: He has a normal mood and affect. His behavior is normal. Judgment and thought content normal.     Assessment/Plan Asthma exacerbation Solumedrol 70m iv q8h zithromax dulera  2puff bid Albuterol 0.083% 1 neb q6h and q6h prn  Leukocytosis Check cbc in am  Tachycardia secondary to anxiety, dyspnea Check d dimer if postive consider CTA chest.  Cycle cardiac markers, check tsh Consider echo if persistent,  Try to stop etoh,  Watch for signs of withdrawal  Etoh dep Librium 535mpo qday x1 day then 2526mo qday x1 day  Tobacco use counselled for 5 minutes on tobacco cessation Nicotine patch prn   KIMJani Gravel9/2016, 10:11 PM

## 2014-02-19 NOTE — ED Provider Notes (Signed)
CSN: [id number]     Arrival date & time 02/19/14  1840 History   First MD Initiated Contact with Patient 02/19/14 1910     Chief Complaint  Patient presents with  . Asthma  . Cough   (Consider location/radiation/quality/duration/timing/severity/associated sxs/prior Treatment) HPI John Fox is a 29 yo male presenting with report of worsening shortness of breath and wheezing onset today.  He reports poorly controlled asthma requiring multiple daily albuterol nebs and inhaler uses.  He reports 2 emergency department visits in the last month for his asthma exacerbation and was hospitalized last month for his worsening asthma. He states he finished a course of antibiotics 2 days ago and was doing well until this am when he woke up with chest tightness, wheezing and nonproductive cough.  He denies recent fever, chills, productive cough, or  hemoptysis.  Past Medical History  Diagnosis Date  . Asthma   . Chronic back pain    History reviewed. No pertinent past surgical history. No family history on file. History  Substance Use Topics  . Smoking status: Current Every Day Smoker -- 0.50 packs/day    Types: Cigarettes  . Smokeless tobacco: Not on file  . Alcohol Use: Yes     Comment: every day 3 40's to a 12 pack    Review of Systems  Constitutional: Negative for fever and chills.  HENT: Negative for sore throat.   Eyes: Negative for visual disturbance.  Respiratory: Positive for cough, chest tightness, shortness of breath and wheezing.   Cardiovascular: Negative for chest pain and leg swelling.  Gastrointestinal: Negative for nausea, vomiting and diarrhea.  Genitourinary: Negative for dysuria.  Musculoskeletal: Negative for myalgias.  Skin: Negative for rash.  Neurological: Negative for weakness, numbness and headaches.      Allergies  Review of patient's allergies indicates no known allergies.  Home Medications   Prior to Admission medications   Medication Sig Start Date  End Date Taking? Authorizing Provider  albuterol (PROVENTIL HFA;VENTOLIN HFA) 108 (90 BASE) MCG/ACT inhaler Inhale 1-2 puffs into the lungs every 6 (six) hours as needed for wheezing. 09/02/12   Burgess Amor, PA-C  cyclobenzaprine (FLEXERIL) 10 MG tablet Take 10 mg by mouth 3 (three) times daily as needed for muscle spasms.    Historical Provider, MD  HYDROcodone-acetaminophen (NORCO) 10-325 MG per tablet Take 1 tablet by mouth every 6 (six) hours as needed for pain.    Historical Provider, MD  naproxen (NAPROSYN) 500 MG tablet Take 1 tablet (500 mg total) by mouth 2 (two) times daily with a meal. 09/07/12   Tammy L. Triplett, PA-C  oxyCODONE-acetaminophen (PERCOCET/ROXICET) 5-325 MG per tablet Take 1 tablet by mouth every 4 (four) hours as needed for pain. 09/07/12   Tammy L. Triplett, PA-C  predniSONE (DELTASONE) 20 MG tablet Take 3 tablets (60 mg total) by mouth daily. 09/02/12   Burgess Amor, PA-C   BP 118/94 mmHg  Pulse 143  Temp(Src) 98.4 F (36.9 C) (Oral)  Resp 28  SpO2 89% Physical Exam  Constitutional: He is oriented to person, place, and time. He appears well-developed and well-nourished. No distress.  HENT:  Head: Normocephalic and atraumatic.  Mouth/Throat: Oropharynx is clear and moist. No oropharyngeal exudate.  Eyes: Conjunctivae are normal.  Neck: Neck supple. No thyromegaly present.  Cardiovascular: Regular rhythm and intact distal pulses.  Tachycardia present.   Pulmonary/Chest: Effort normal. No respiratory distress. He has no decreased breath sounds. He has wheezes in the right upper field, the right middle  field, the right lower field, the left upper field, the left middle field and the left lower field. He has no rhonchi. He has no rales. He exhibits no tenderness.  Abdominal: Soft. There is no tenderness.  Musculoskeletal: He exhibits no tenderness.  Lymphadenopathy:    He has no cervical adenopathy.  Neurological: He is alert and oriented to person, place, and time. GCS  eye subscore is 4. GCS verbal subscore is 5. GCS motor subscore is 6.  Skin: Skin is warm and dry. No rash noted. He is not diaphoretic.  Psychiatric: He has a normal mood and affect.  Nursing note and vitals reviewed.   ED Course  Procedures (including critical care time) CRITICAL CARE Performed by: Harle Battiest   Total critical care time: 30 min  Critical care time was exclusive of separately billable procedures and treating other patients.  Critical care was necessary to treat or prevent imminent or life-threatening deterioration.  Critical care was time spent personally by me on the following activities: development of treatment plan with patient and/or surrogate as well as nursing, discussions with consultants, evaluation of patient's response to treatment, examination of patient, obtaining history from patient or surrogate, ordering and performing treatments and interventions, ordering and review of laboratory studies, ordering and review of radiographic studies, pulse oximetry and re-evaluation of patient's condition.  Labs Review Labs Reviewed  CBC WITH DIFFERENTIAL - Abnormal; Notable for the following:    WBC 18.3 (*)    Neutro Abs 13.5 (*)    Monocytes Absolute 1.4 (*)    All other components within normal limits  BASIC METABOLIC PANEL    Imaging Review Dg Chest 2 View  02/19/2014   CLINICAL DATA:  Short of breath, asthma  EXAM: CHEST  2 VIEW  COMPARISON:  Radiograph 01/25/2014  FINDINGS: Normal mediastinum and cardiac silhouette. Normal pulmonary vasculature. No evidence of effusion, infiltrate, or pneumothorax. No acute bony abnormality.  IMPRESSION: No acute cardiopulmonary process.   Electronically Signed   By: Genevive Bi M.D.   On: 02/19/2014 20:52     EKG Interpretation   Date/Time:  Saturday February 19 2014 19:18:28 EST Ventricular Rate:  138 PR Interval:  115 QRS Duration: 87 QT Interval:  365 QTC Calculation: 553 R Axis:   -6 Text  Interpretation:  Sinus tachycardia Nonspecific T abnormalities,  lateral leads Prolonged QT interval Confirmed by Juleen China  MD, STEPHEN ([id number])  on 02/19/2014 7:52:31 PM      MDM   Final diagnoses:  SOB (shortness of breath)   29 yo male with poorly controlled asthma with shortness of breath and wheezing today not relieved by home treatments. Duoneb given in triage still with diffuse wheezes and poor air movement. t is also tachycardic and reports daily alcohol intake.  CBC, BMP, CXR, EKG, Continuous neb, IV solu-medrol, IV ativan.  Discussed case with Dr. Juleen China.  CXR: negative for acute finding  Labs resulted: WBC elevated to 18.3, in light of negative cxr and no fever, most likely related to pt's recent steroid use.   Pt ambulated on room air and could maintain O2 sats at 94% initially  But still reports shortness of breath.  While not ambulating on room air, O2 continued to decrease to 91%.  Consulted Dr. Selena Batten (hospitalist) regarding pt status and admission for continued treatment of asthma exacerbation. The patient appears reasonably stabilized for admission considering the current resources, flow, and capabilities available in the ED at this time, and I doubt any further screening and/or treatment  in the ED prior to admission.   Filed Vitals:   02/19/14 1848 02/19/14 1931 02/19/14 1942 02/19/14 2007  BP: 118/94   120/69  Pulse: 143   138  Temp: 98.4 F (36.9 C)     TempSrc: Oral     Resp: 28   18  SpO2: 89% 97% 94% 100%   Meds given in ED:  Medications  albuterol (PROVENTIL,VENTOLIN) solution continuous neb (not administered)  methylPREDNISolone sodium succinate (SOLU-MEDROL) 125 mg/2 mL injection 125 mg (not administered)  sodium chloride 0.9 % bolus 1,000 mL (not administered)  ketorolac (TORADOL) 30 MG/ML injection 30 mg (not administered)  LORazepam (ATIVAN) injection 1 mg (not administered)  ipratropium-albuterol (DUONEB) 0.5-2.5 (3) MG/3ML nebulizer solution 3 mL (3 mLs  Nebulization Given 02/19/14 1859)    New Prescriptions   No medications on file       Harle BattiestElizabeth Kahner Yanik, NP 02/20/14 1229  Raeford RazorStephen Kohut, MD 02/21/14 1126

## 2014-02-20 LAB — TROPONIN I

## 2014-02-20 LAB — TSH: TSH: 1.141 u[IU]/mL (ref 0.350–4.500)

## 2014-02-20 LAB — D-DIMER, QUANTITATIVE: D-Dimer, Quant: <0.27 ug/mL-FEU (ref 0.00–0.48)

## 2014-02-20 MED ORDER — AZITHROMYCIN 250 MG PO TABS: ORAL_TABLET | ORAL | Status: AC

## 2014-02-20 MED ORDER — PREDNISONE 10 MG PO TABS: ORAL_TABLET | ORAL | Status: AC

## 2014-02-20 MED ORDER — MOMETASONE FURO-FORMOTEROL FUM 100-5 MCG/ACT IN AERO: 2.0000 | INHALATION_SPRAY | Freq: Two times a day (BID) | RESPIRATORY_TRACT | Status: AC

## 2014-02-20 MED ORDER — PREDNISONE 10 MG PO TABS: ORAL_TABLET | ORAL | Status: DC

## 2014-02-20 NOTE — Discharge Summary (Signed)
Physician Discharge Summary  John Fox:[id number] DOB: [birth year] DOA: 02/19/2014  PCP: No PCP Per Patient  Admit date: 02/19/2014 Discharge date: 02/20/2014  Time spent: 40 minutes  Recommendations for Outpatient Follow-up:  1. Follow-up with primary care physician in one week, patient follows with [facility] does not know his primary care physician name  Discharge Diagnoses:  Active Problems:   Asthma exacerbation   Tachycardia   Tobacco use   Discharge Condition: Stable  Diet recommendation: Regular  Filed Weights   02/19/14 2258 02/20/14 0546  Weight: 88.814 kg (195 lb 12.8 oz) 87 kg (191 lb 12.8 oz)    History of present illness:  29 yo male with asthma, chronic tobacco use, apparently was at [facility] and had dyspnea. Pt was dropped off by his girlfriend. Pt denies fever, chills, cp, palp, has cough with occasional yellow/brown sputum but mostly clear. Denies n/v, diarrhea, brbpr, black stool. In ED pt given steroids and albuterol and still wheezing, Admission requested for asthma exacerbation. Pt notes that has been compliant w medication.   Hospital Course:   Acute asthma exacerbation/acute bronchitis Patient presented to the hospital shortness of breath, sputum production and wheezing. Started on high dose of IV Solu-Medrol and albuterol nebulizer. Patient mentioned that he has sputum with yellow color, started on Z-Pak. Requested to be discharged, discharge on prednisone taper, Z-Pak and to continue his albuterol nebulization at home. Also discharged on [facility] as well.  Tachycardia Secondary to respiratory distress, this is resolved.  Tobacco abuse  Patient counseled about tobacco, special discussed the role of tobacco in people with chronic lung disease like asthma.  Procedures:  None  Consultations:  None  Discharge Exam: Filed Vitals:   02/20/14 0546  BP: 129/69  Pulse: 100  Temp: 97.5 F (36.4 C)  Resp: 20   General: Alert and  awake, oriented x3, not in any acute distress. HEENT: anicteric sclera, pupils reactive to light and accommodation, EOMI CVS: S1-S2 clear, no murmur rubs or gallops Chest: clear to auscultation bilaterally, no wheezing, rales or rhonchi Abdomen: soft nontender, nondistended, normal bowel sounds, no organomegaly Extremities: no cyanosis, clubbing or edema noted bilaterally Neuro: Cranial nerves II-XII intact, no focal neurological deficits  Discharge Instructions   Discharge Instructions    Increase activity slowly    Complete by:  As directed           Current Discharge Medication List    START taking these medications   Details  azithromycin (ZITHROMAX) 250 MG tablet Take 2 tablets in the first day, then 1 tablet by mouth daily until gone. Qty: 6 each, Refills: 0    mometasone-formoterol (DULERA) 100-5 MCG/ACT AERO Inhale 2 puffs into the lungs 2 (two) times daily. Qty: 1 Inhaler, Refills: 0      CONTINUE these medications which have CHANGED   Details  predniSONE (DELTASONE) 10 MG tablet Take 6-5-4-3-2-1 PO daily till gone Qty: 21 tablet, Refills: 0      CONTINUE these medications which have NOT CHANGED   Details  albuterol (PROVENTIL HFA;VENTOLIN HFA) 108 (90 BASE) MCG/ACT inhaler Inhale 1-2 puffs into the lungs every 6 (six) hours as needed for wheezing. Qty: 1 Inhaler, Refills: 0    ALPRAZolam (XANAX) 1 MG tablet Take 1 mg by mouth daily.    diphenhydrAMINE (BENADRYL) 25 MG tablet Take 50 mg by mouth daily.    naproxen (NAPROSYN) 500 MG tablet Take 1 tablet (500 mg total) by mouth 2 (two) times daily with a meal. Qty:  20 tablet, Refills: 0    oxyCODONE-acetaminophen (PERCOCET/ROXICET) 5-325 MG per tablet Take 1 tablet by mouth every 4 (four) hours as needed for pain. Qty: 10 tablet, Refills: 0       No Known Allergies    The results of significant diagnostics from this hospitalization (including imaging, microbiology, ancillary and laboratory) are listed  below for reference.    Significant Diagnostic Studies: Dg Chest 2 View  02/19/2014   CLINICAL DATA:  Short of breath, asthma  EXAM: CHEST  2 VIEW  COMPARISON:  Radiograph 01/25/2014  FINDINGS: Normal mediastinum and cardiac silhouette. Normal pulmonary vasculature. No evidence of effusion, infiltrate, or pneumothorax. No acute bony abnormality.  IMPRESSION: No acute cardiopulmonary process.   Electronically Signed   By: Genevive BiStewart  Edmunds M.D.   On: 02/19/2014 20:52    Microbiology: No results found for this or any previous visit (from the past 240 hour(s)).   Labs: Basic Metabolic Panel:  Recent Labs Lab 02/19/14 1934  NA 139  K 3.5  CL 105  CO2 23  GLUCOSE 87  BUN 15  CREATININE 0.98  CALCIUM 9.0   Liver Function Tests: No results for input(s): AST, ALT, ALKPHOS, BILITOT, PROT, ALBUMIN in the last 168 hours. No results for input(s): LIPASE, AMYLASE in the last 168 hours. No results for input(s): AMMONIA in the last 168 hours. CBC:  Recent Labs Lab 02/19/14 1934  WBC 18.3*  NEUTROABS 13.5*  HGB 15.7  HCT 46.6  MCV 87.3  PLT 347   Cardiac Enzymes:  Recent Labs Lab 02/19/14 2335 02/20/14 0556 02/20/14 1037  TROPONINI <0.03 <0.03 <0.03   BNP: BNP (last 3 results) No results for input(s): PROBNP in the last 8760 hours. CBG: No results for input(s): GLUCAP in the last 168 hours.     Signed:  Bauer Ausborn A  [facility] 02/20/2014, 12:42 PM

## 2016-05-06 IMAGING — CR DG CHEST 2V
2 series · 2 of 2 positions shown · non-contrast
Comparison: Radiograph 01/25/2014

CLINICAL DATA: Short of breath, asthma

EXAM:
CHEST  2 VIEW

[w chest pa]
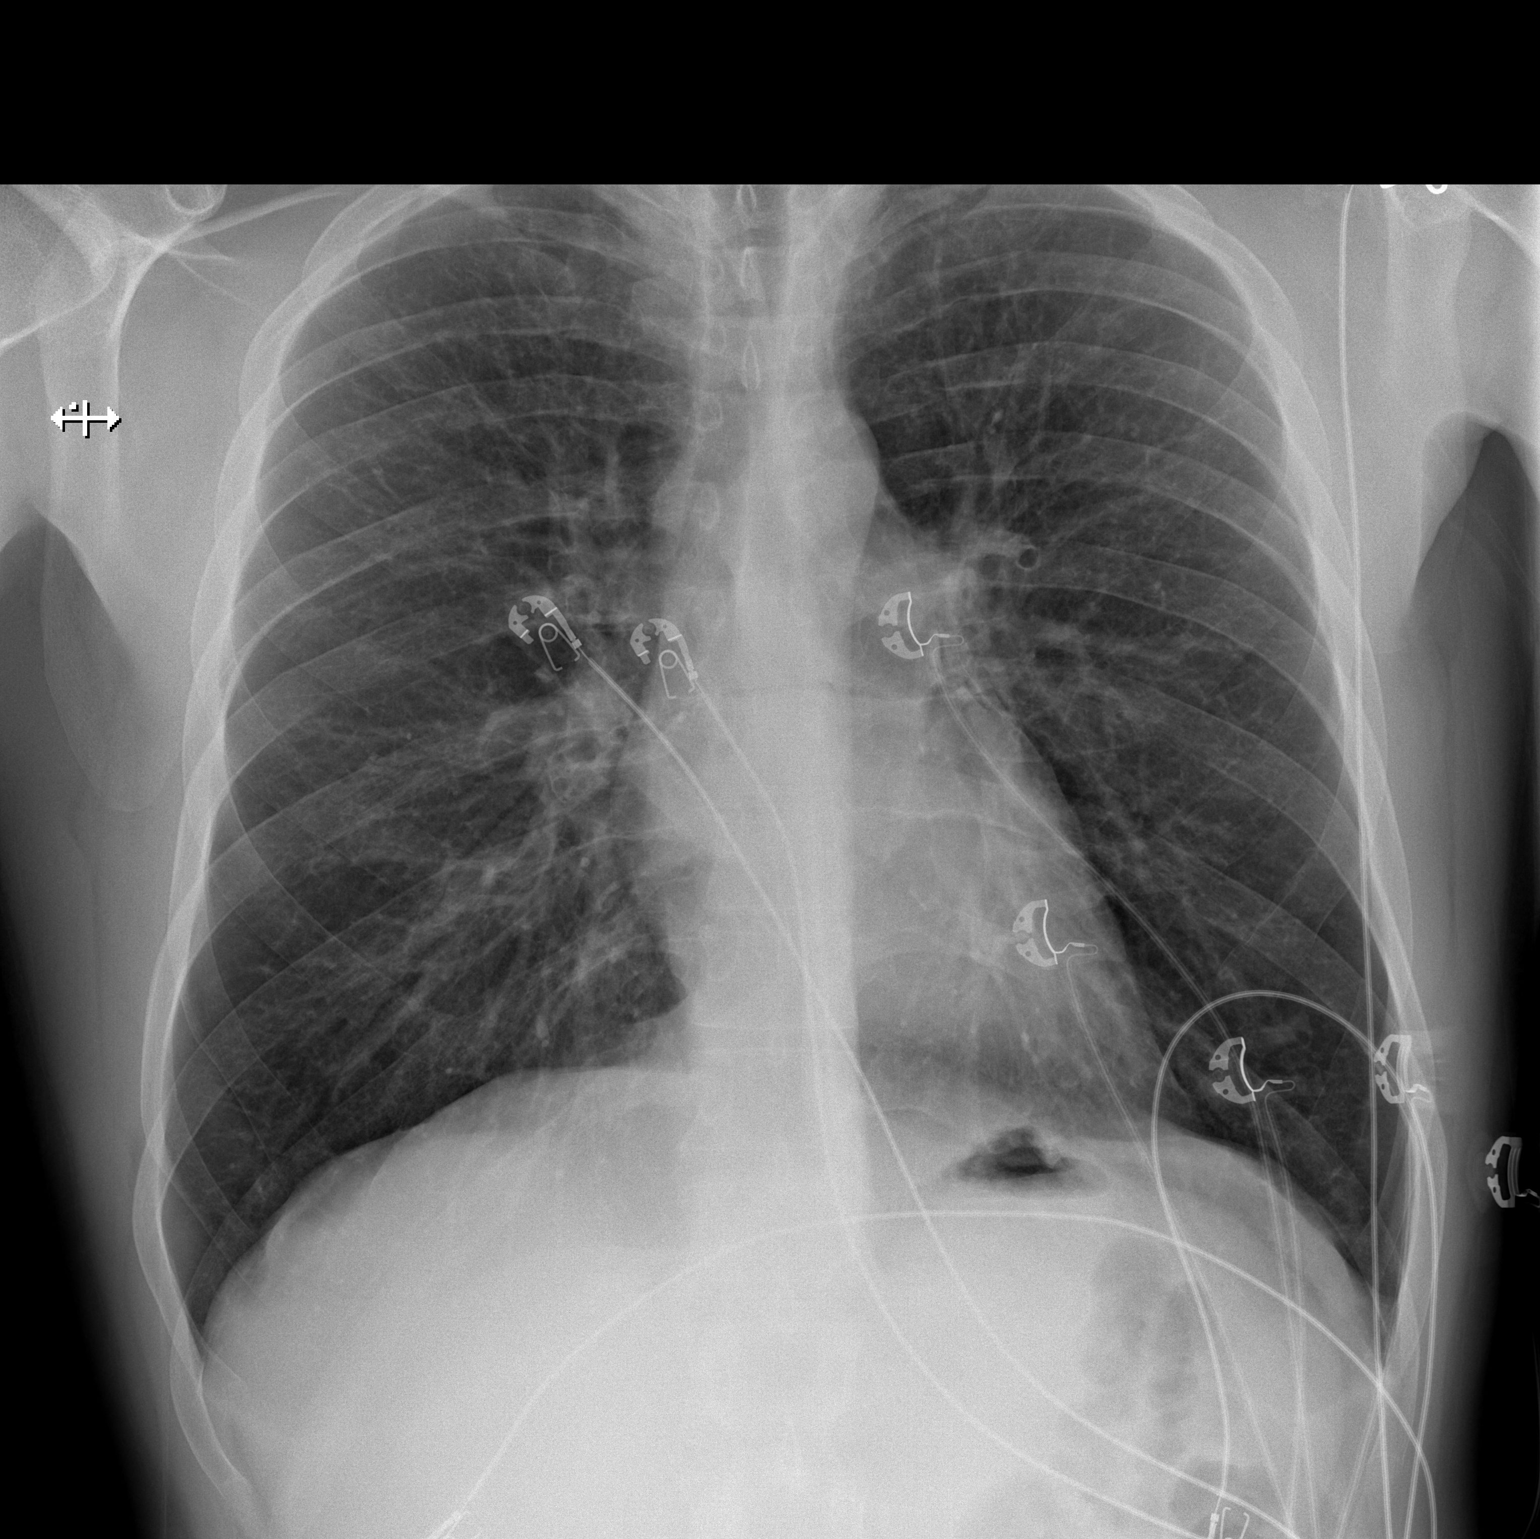

[w chest lat]
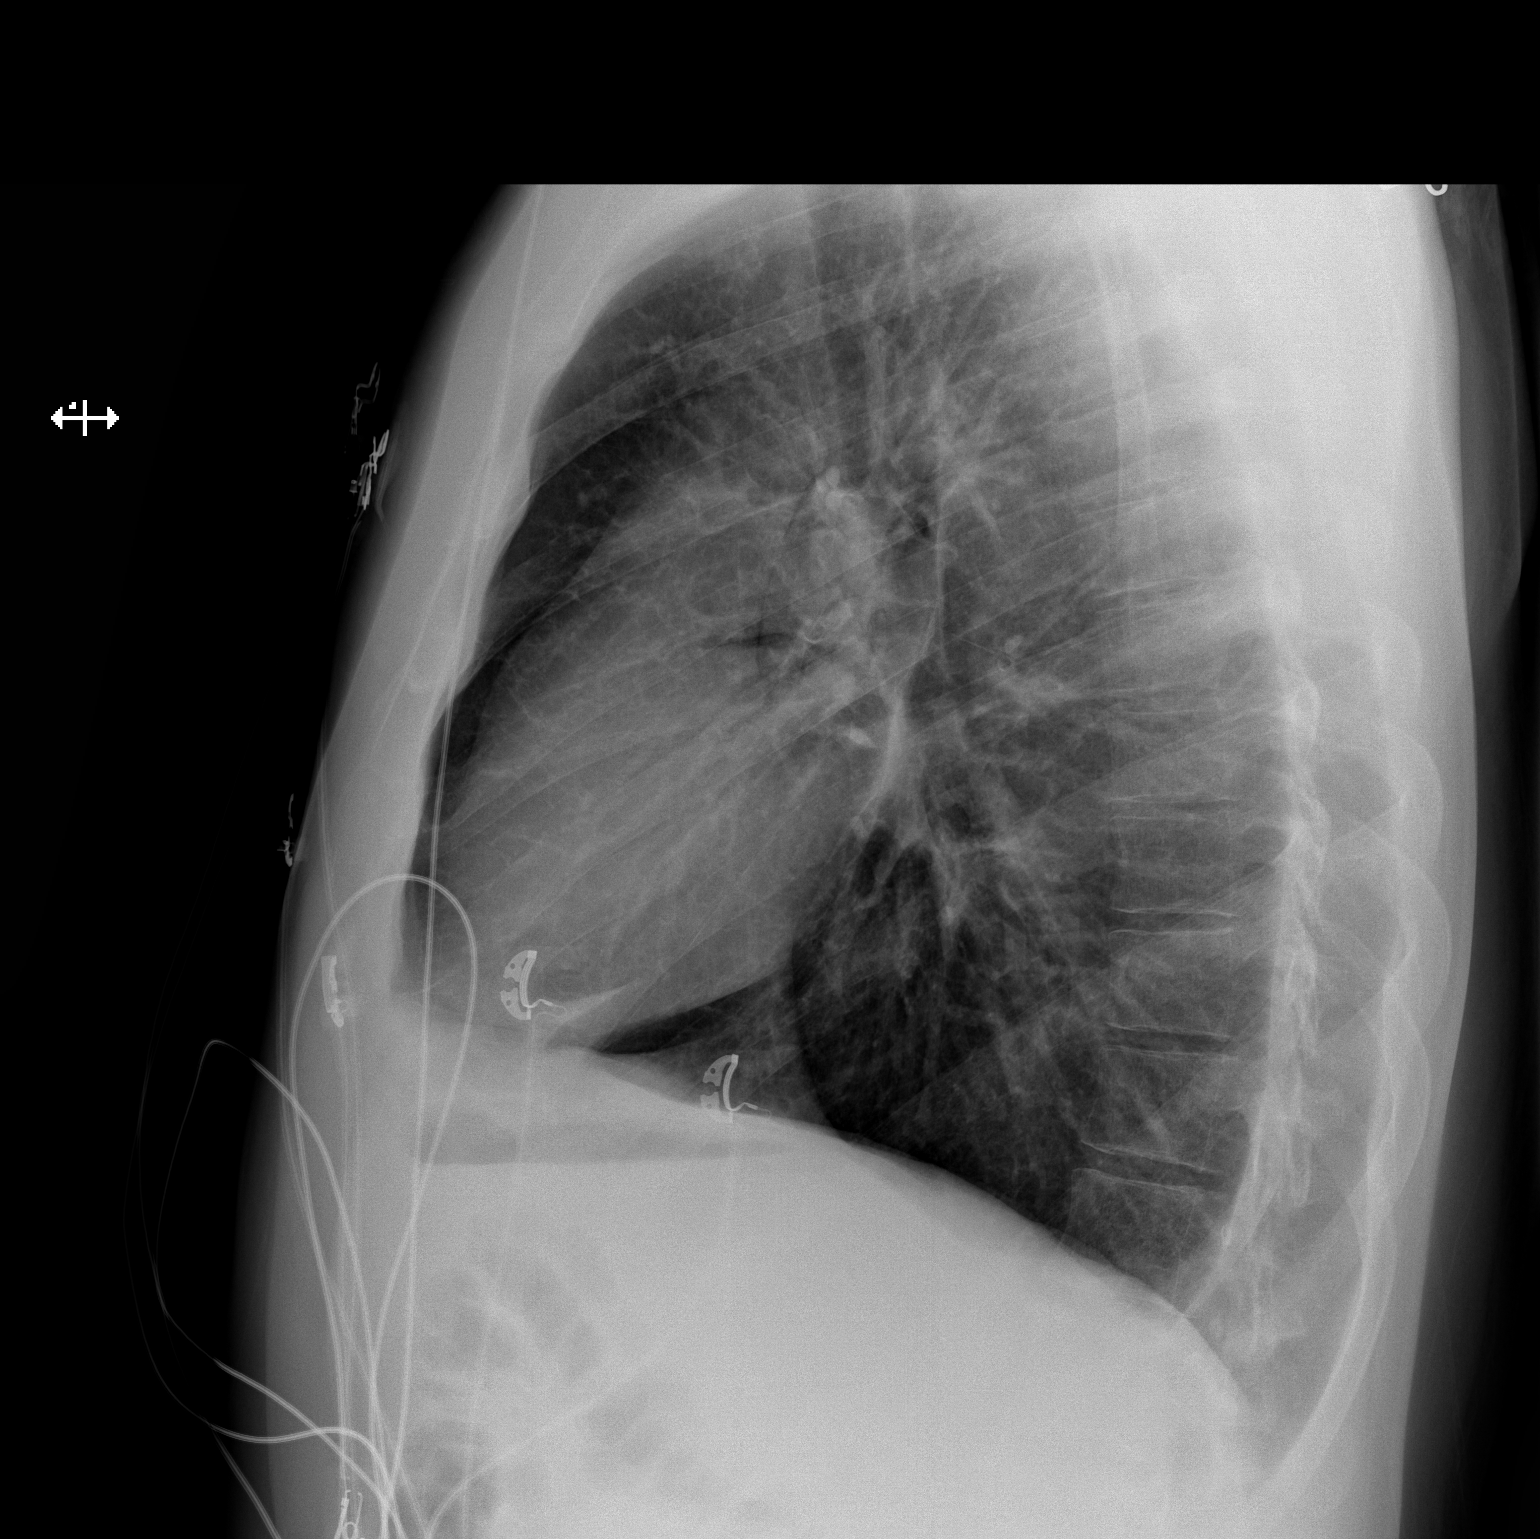

[2 of 2 positions shown; findings below may reference images not displayed]

FINDINGS: Normal mediastinum and cardiac silhouette. Normal pulmonary
vasculature. No evidence of effusion, infiltrate, or pneumothorax.
No acute bony abnormality.
IMPRESSION: No acute cardiopulmonary process.
